# Patient Record
Sex: Female | Born: 1940 | Race: White | Hispanic: No | Marital: Married | Smoking: Never smoker
Health system: Southern US, Community
[De-identification: ages and names within clinical notes are randomized; demographics above are authoritative.]

## PROBLEM LIST (undated history)

## (undated) DIAGNOSIS — J45909 Unspecified asthma, uncomplicated: Secondary | ICD-10-CM

## (undated) DIAGNOSIS — C801 Malignant (primary) neoplasm, unspecified: Secondary | ICD-10-CM

---

## 2010-09-11 ENCOUNTER — Ambulatory Visit: Payer: Self-pay | Admitting: *Deleted

## 2012-12-23 ENCOUNTER — Ambulatory Visit: Payer: Self-pay | Admitting: Family Medicine

## 2012-12-23 LAB — CBC WITH DIFFERENTIAL/PLATELET
Eosinophil #: 0.1 10*3/uL (ref 0.0–0.7)
HCT: 45.8 % (ref 35.0–47.0)
HGB: 15.1 g/dL (ref 12.0–16.0)
Lymphocyte %: 13.9 %
MCH: 32.6 pg (ref 26.0–34.0)
MCV: 99 fL (ref 80–100)
Monocyte %: 3.7 %
Neutrophil #: 6.8 10*3/uL — ABNORMAL HIGH (ref 1.4–6.5)
Neutrophil %: 81.3 %
RBC: 4.63 10*6/uL (ref 3.80–5.20)
RDW: 12.9 % (ref 11.5–14.5)
WBC: 8.4 10*3/uL (ref 3.6–11.0)

## 2012-12-23 LAB — COMPREHENSIVE METABOLIC PANEL
Alkaline Phosphatase: 106 U/L (ref 50–136)
Anion Gap: 10 (ref 7–16)
BUN: 20 mg/dL — ABNORMAL HIGH (ref 7–18)
Chloride: 105 mmol/L (ref 98–107)
Co2: 29 mmol/L (ref 21–32)
EGFR (Non-African Amer.): 60 — ABNORMAL LOW
Glucose: 111 mg/dL — ABNORMAL HIGH (ref 65–99)
SGOT(AST): 17 U/L (ref 15–37)
SGPT (ALT): 31 U/L (ref 12–78)
Sodium: 144 mmol/L (ref 136–145)
Total Protein: 7.1 g/dL (ref 6.4–8.2)

## 2013-03-24 ENCOUNTER — Ambulatory Visit: Payer: Self-pay | Admitting: Family Medicine

## 2014-02-18 ENCOUNTER — Ambulatory Visit: Payer: Self-pay | Admitting: Emergency Medicine

## 2014-02-19 ENCOUNTER — Ambulatory Visit: Payer: Self-pay | Admitting: Physician Assistant

## 2015-03-11 IMAGING — CT CT HEAD WITHOUT CONTRAST
1 series · 16 of 30 positions shown, 20 images · non-contrast
Comparison: none

REASON FOR EXAM: STAT CALLREPORT [id number]  RT occipital headache x 2wks
 w dizzness nausea
COMMENTS:

[Series 2: soft tissue · axial · 0.39mm/px · z∈[-28,+107]mm · 16 of 31 slices shown, 20 images]
[im 2/31  brain]
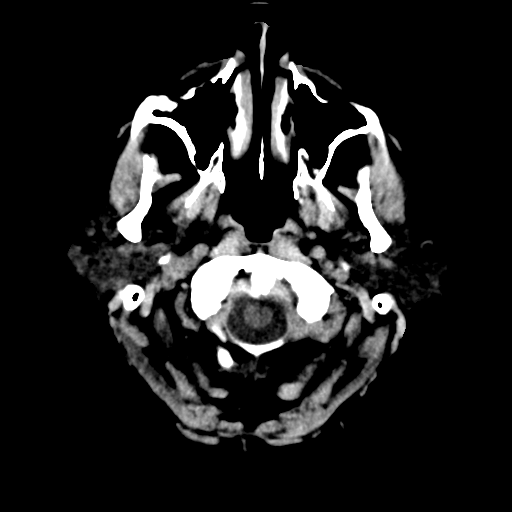
[im 2/31  bone]
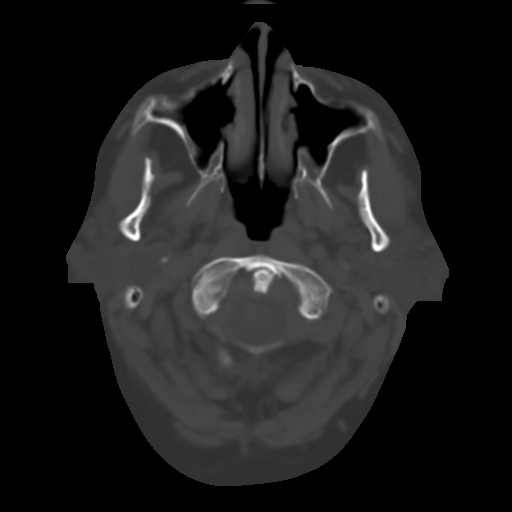
[im 4/31  brain]
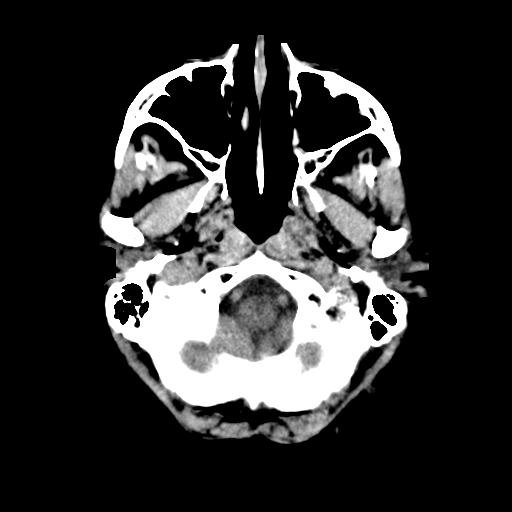
[im 6/31  brain]
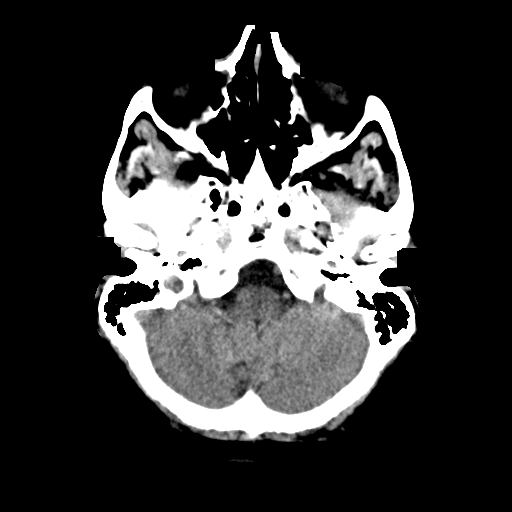
[im 8/31  brain]
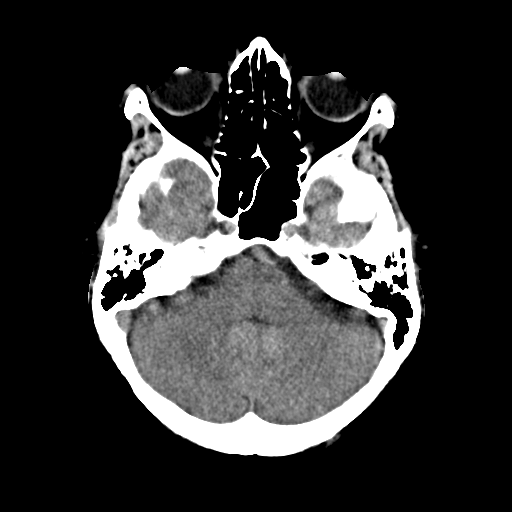
[im 9/31  brain]
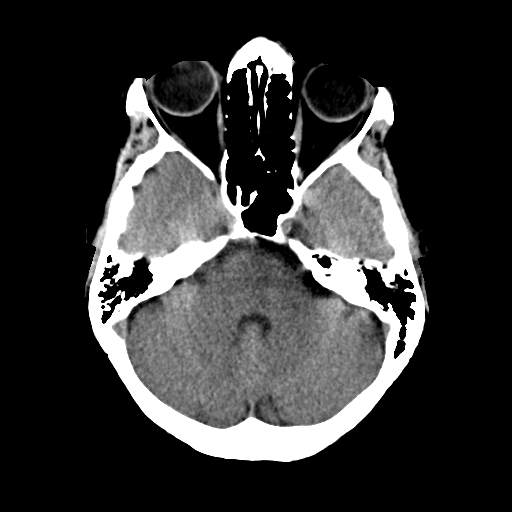
[im 9/31  bone]
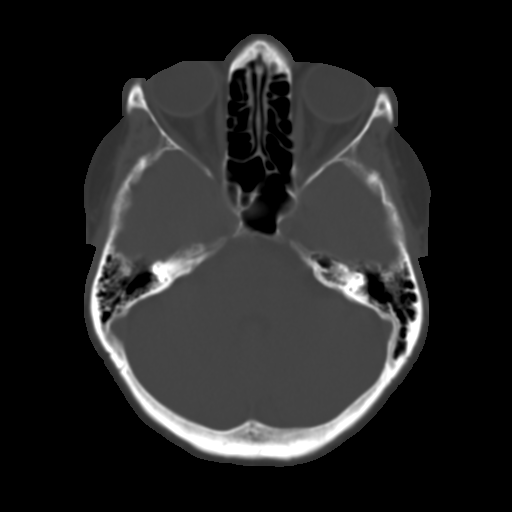
[im 11/31  brain]
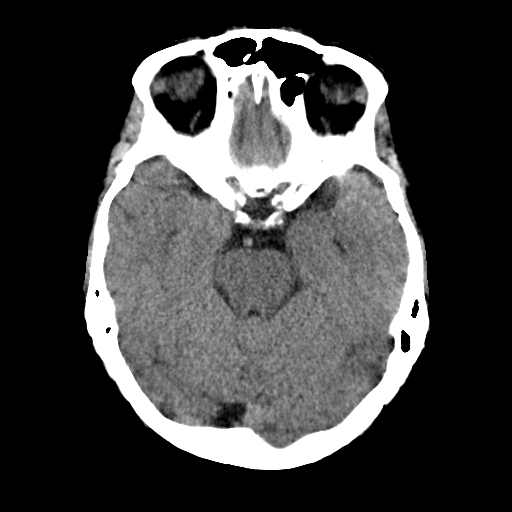
[im 13/31  brain]
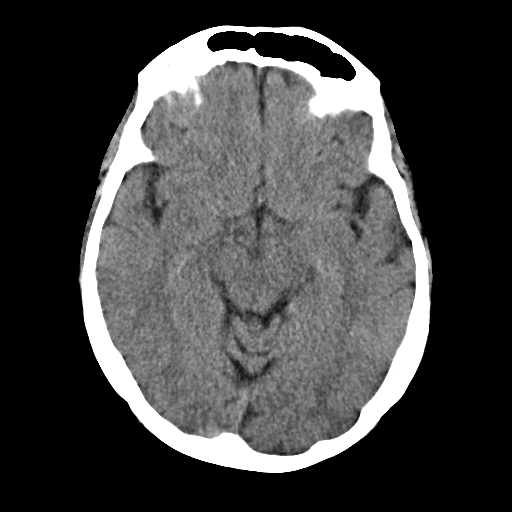
[im 15/31  brain]
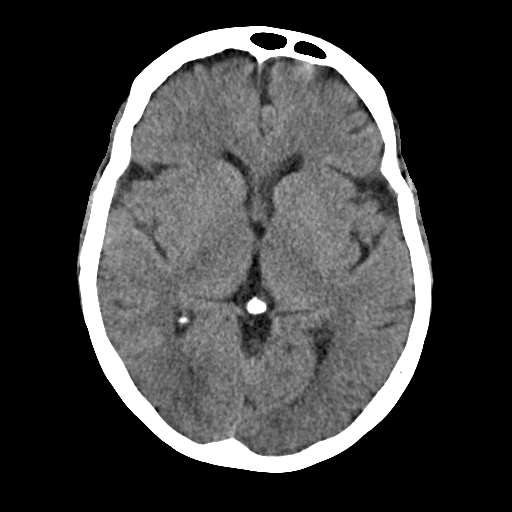
[im 16/31  brain]
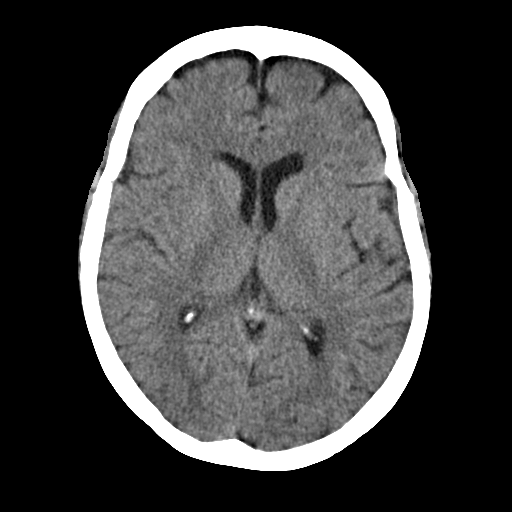
[im 16/31  bone]
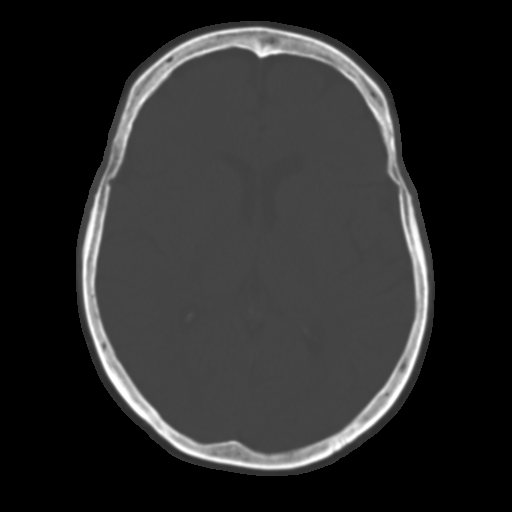
[im 18/31  brain]
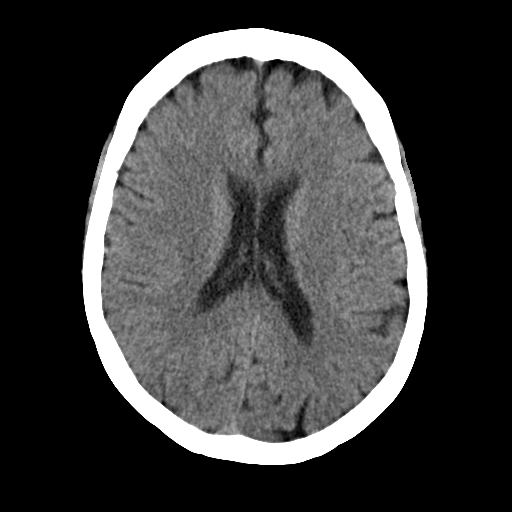
[im 20/31  brain]
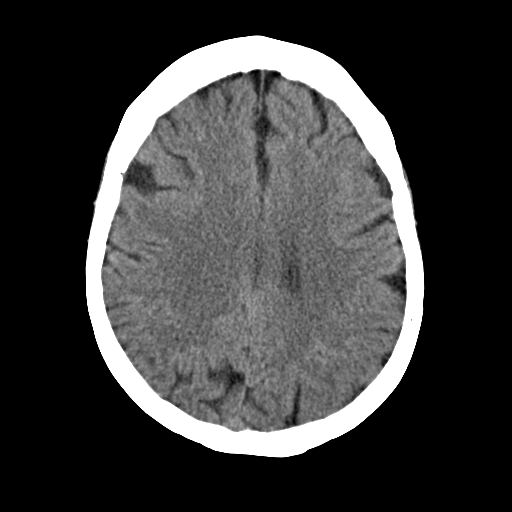
[im 22/31  brain]
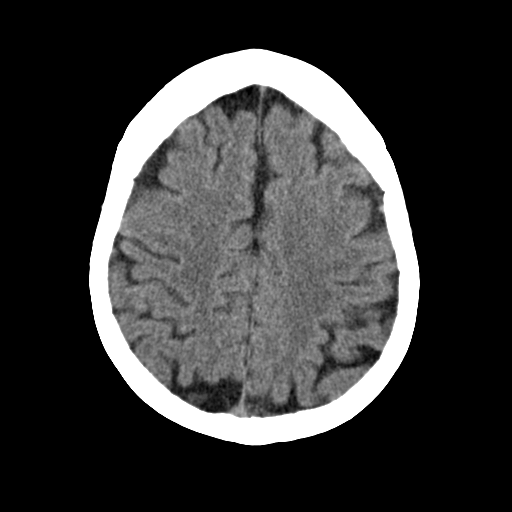
[im 23/31  brain]
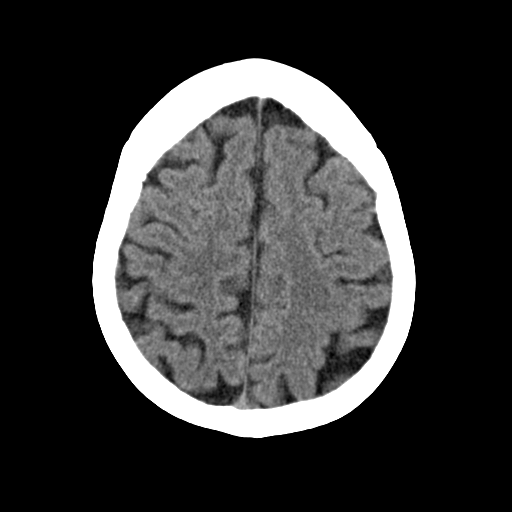
[im 23/31  bone]
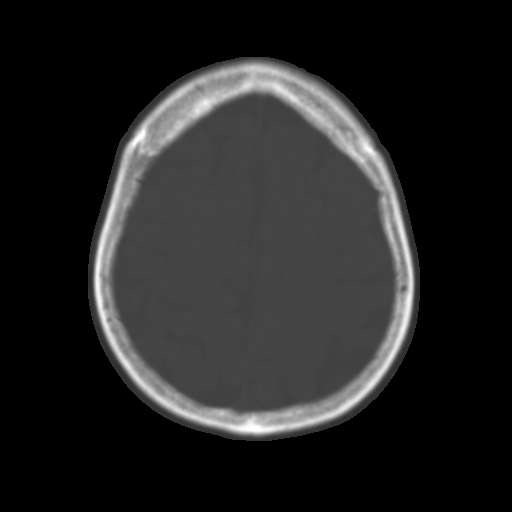
[im 25/31  brain]
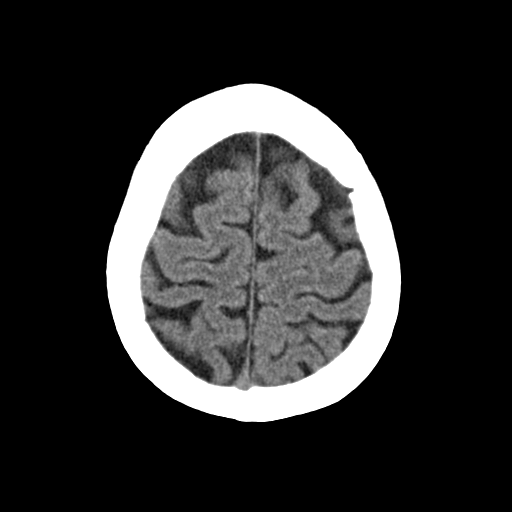
[im 27/31  brain]
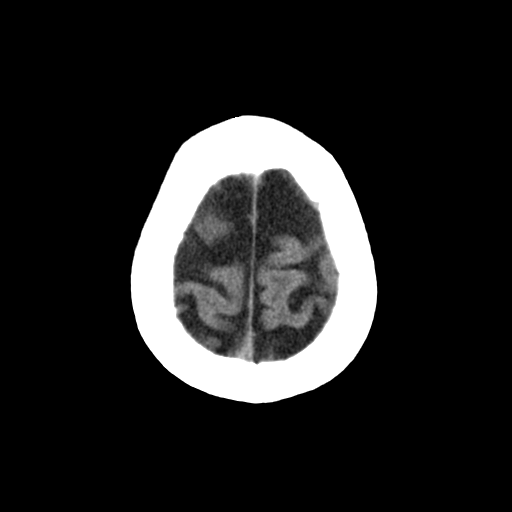
[im 29/31  brain]
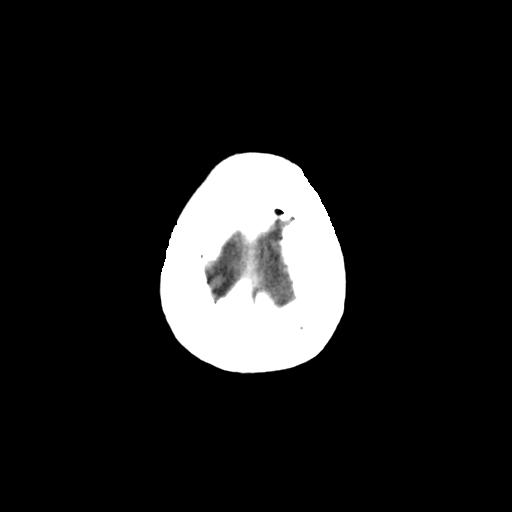

[16 of 30 positions shown; findings below may reference images not displayed]

PROCEDURE:     [facility] WITHOUT CONTRAST  - December 23, 2012  [DATE]

RESULT:     A noncontrast CT of the brain is performed in the standard
fashion. The patient has no previous exam for comparison. There is mild
prominence of the ventricles and sulci. There is no intracranial hemorrhage,
mass, mass effect or midline shift. There is no evolving infarct evident.
The orbits appear to be unremarkable. The sinuses and mastoid air cells show
grossly normal aeration area the calvarium appears intact.
IMPRESSION: 1. Mild atrophy. No acute intracranial abnormality.

[REDACTED]

## 2015-05-21 ENCOUNTER — Ambulatory Visit
Admission: RE | Admit: 2015-05-21 | Discharge: 2015-05-21 | Disposition: A | Discharge status: Home / Self Care | Payer: Medicare Other | Source: Ambulatory Visit | Attending: Family Medicine | Admitting: Family Medicine

## 2015-05-21 ENCOUNTER — Other Ambulatory Visit: Payer: Self-pay | Admitting: Family Medicine

## 2015-05-21 DIAGNOSIS — Z1231 Encounter for screening mammogram for malignant neoplasm of breast: Secondary | ICD-10-CM | POA: Diagnosis not present

## 2015-05-21 DIAGNOSIS — M8580 Other specified disorders of bone density and structure, unspecified site: Secondary | ICD-10-CM

## 2015-05-21 DIAGNOSIS — M8588 Other specified disorders of bone density and structure, other site: Secondary | ICD-10-CM | POA: Diagnosis not present

## 2015-05-21 DIAGNOSIS — M85851 Other specified disorders of bone density and structure, right thigh: Secondary | ICD-10-CM | POA: Diagnosis not present

## 2015-05-21 HISTORY — DX: Malignant (primary) neoplasm, unspecified: C80.1

## 2015-05-22 ENCOUNTER — Other Ambulatory Visit: Payer: Self-pay | Admitting: Family Medicine

## 2015-05-22 DIAGNOSIS — Z1231 Encounter for screening mammogram for malignant neoplasm of breast: Secondary | ICD-10-CM

## 2015-05-22 DIAGNOSIS — M8580 Other specified disorders of bone density and structure, unspecified site: Secondary | ICD-10-CM

## 2020-04-16 ENCOUNTER — Other Ambulatory Visit: Payer: Self-pay | Admitting: Internal Medicine

## 2020-04-16 DIAGNOSIS — R0602 Shortness of breath: Secondary | ICD-10-CM

## 2020-04-16 DIAGNOSIS — I480 Paroxysmal atrial fibrillation: Secondary | ICD-10-CM

## 2020-04-16 DIAGNOSIS — I4891 Unspecified atrial fibrillation: Secondary | ICD-10-CM

## 2020-04-22 ENCOUNTER — Other Ambulatory Visit: Payer: Self-pay

## 2020-04-22 ENCOUNTER — Ambulatory Visit
Admission: RE | Admit: 2020-04-22 | Discharge: 2020-04-22 | Disposition: A | Discharge status: Home / Self Care | Payer: Medicare PPO | Source: Ambulatory Visit | Attending: Internal Medicine | Admitting: Internal Medicine

## 2020-04-22 DIAGNOSIS — I4891 Unspecified atrial fibrillation: Secondary | ICD-10-CM | POA: Diagnosis present

## 2020-04-22 DIAGNOSIS — R0602 Shortness of breath: Secondary | ICD-10-CM | POA: Insufficient documentation

## 2020-04-22 DIAGNOSIS — I480 Paroxysmal atrial fibrillation: Secondary | ICD-10-CM | POA: Diagnosis present

## 2020-04-22 LAB — NM MYOCAR MULTI W/SPECT W/WALL MOTION / EF
Estimated workload: 1 METS
Exercise duration (sec): 55 s
LV dias vol: 52 mL (ref 46–106)
LV sys vol: 20 mL
MPHR: 138 {beats}/min
Peak HR: 93 {beats}/min
Percent HR: 67 %
Rest HR: 74 {beats}/min
SDS: 0
SRS: 6
SSS: 4
TID: 1.04

## 2020-04-22 MED ORDER — TECHNETIUM TC 99M TETROFOSMIN IV KIT
30.7300 | PACK | Freq: Once | INTRAVENOUS | Status: AC | PRN
  Administered 2020-04-22: 30.73 via INTRAVENOUS

## 2020-04-22 MED ORDER — TECHNETIUM TC 99M TETROFOSMIN IV KIT
9.7510 | PACK | Freq: Once | INTRAVENOUS | Status: AC | PRN
  Administered 2020-04-22: 9.751 via INTRAVENOUS

## 2020-04-22 MED ORDER — REGADENOSON 0.4 MG/5ML IV SOLN
0.4000 mg | Freq: Once | INTRAVENOUS | Status: AC
  Administered 2020-04-22: 0.4 mg via INTRAVENOUS

## 2022-07-13 ENCOUNTER — Ambulatory Visit
Admission: EM | Admit: 2022-07-13 | Discharge: 2022-07-13 | Disposition: A | Discharge status: Home / Self Care | Payer: Medicare PPO

## 2022-07-13 ENCOUNTER — Encounter: Payer: Self-pay | Admitting: Emergency Medicine

## 2022-07-13 DIAGNOSIS — W57XXXA Bitten or stung by nonvenomous insect and other nonvenomous arthropods, initial encounter: Secondary | ICD-10-CM | POA: Diagnosis not present

## 2022-07-13 DIAGNOSIS — S80862A Insect bite (nonvenomous), left lower leg, initial encounter: Secondary | ICD-10-CM | POA: Diagnosis not present

## 2022-07-13 HISTORY — DX: Unspecified asthma, uncomplicated: J45.909

## 2022-07-13 NOTE — ED Provider Notes (Signed)
[facility]    CSN: [id number] Arrival date & time: 07/13/22  1338      History   Chief Complaint Chief Complaint  Patient presents with   Rash    HPI Kimberly Blackwell is a 82 y.o. female who presents with a rash behind her L leg x 3 days. She was at the beach when she first noticed it. Felt some itching and a bump. She only walked to the sand, stuck her toes in the water, then went back to her place. She has been applying a steroid cream prescribed by her doctor to help the itching and has stopped itching, but noticed it turn from red to purple. Her kids wanted her to be examined. She denies pain.     Past Medical History:  Diagnosis Date   Asthma    Cancer (HCC)    skin ca    There are no problems to display for this patient.   History reviewed. No pertinent surgical history.  OB History   No obstetric history on file.      Home Medications    Prior to Admission medications   Medication Sig Start Date End Date Taking? Authorizing Provider  albuterol (VENTOLIN HFA) 108 (90 Base) MCG/ACT inhaler Inhale into the lungs. 06/25/22 06/25/23 Yes [provider]  azelastine (ASTELIN) 0.1 % nasal spray Place into the nose. 06/26/12 06/24/23 Yes [provider]  fexofenadine (ALLEGRA) 180 MG tablet Take by mouth. 04/30/22 04/30/23 Yes [provider]  fluticasone (FLONASE) 50 MCG/ACT nasal spray Place into the nose. 10/31/21 11/01/22 Yes [provider]  ipratropium-albuterol (DUONEB) 0.5-2.5 (3) MG/3ML SOLN Inhale into the lungs. 10/31/21  Yes [provider]  levocetirizine (XYZAL) 5 MG tablet Take 1 tablet by mouth every evening. 04/30/22 04/30/23 Yes [provider]  metoprolol tartrate (LOPRESSOR) 25 MG tablet Take 1 tablet by mouth 2 (two) times daily. 05/18/22  Yes [provider]  SPIRIVA RESPIMAT 1.25 MCG/ACT AERS Inhale into the lungs. 06/25/22 06/25/23 Yes [provider]  spironolactone (ALDACTONE) 25  MG tablet Take 1 tablet by mouth daily. 03/09/22  Yes [provider]  valsartan (DIOVAN) 160 MG tablet Take 1 tablet by mouth 2 (two) times daily. 04/27/22  Yes [provider]  ADVAIR DISKUS 500-50 MCG/ACT AEPB SMARTSIG:1 Puff(s) By Mouth Every 12 Hours    [provider]  ELIQUIS 5 MG TABS tablet Take 5 mg by mouth 2 (two) times daily.    [provider]  montelukast (SINGULAIR) 10 MG tablet Take 10 mg by mouth daily.    [provider]  potassium chloride (KLOR-CON) 10 MEQ tablet Take 10 mEq by mouth 2 (two) times daily.    [provider]    Family History Family History  Problem Relation Age of Onset   Breast cancer Cousin        mat cousin    Social History Social History   Tobacco Use   Smoking status: Never   Smokeless tobacco: Never  Vaping Use   Vaping Use: Never used  Substance Use Topics   Alcohol use: Not Currently     Allergies   Iodinated contrast media, Levofloxacin, Sulfa antibiotics, Morphine, and Penicillins   Review of Systems Review of Systems   Physical Exam Triage Vital Signs ED Triage Vitals  Enc Vitals Group     BP 07/13/22 1656 116/74     Pulse Rate 07/13/22 1656 62     Resp 07/13/22 1656 16  Temp 07/13/22 1656 97.7 F (36.5 C)     Temp Source 07/13/22 1656 Oral     SpO2 07/13/22 1656 95 %     Weight --      Height --      Head Circumference --      Peak Flow --      Pain Score 07/13/22 1652 0     Pain Loc --      Pain Edu? --      Excl. in GC? --    No data found.  Updated Vital Signs BP 116/74 (BP Location: Left Arm)   Pulse 62   Temp 97.7 F (36.5 C) (Oral)   Resp 16   SpO2 99%   Visual Acuity Right Eye Distance:   Left Eye Distance:   Bilateral Distance:    Right Eye Near:   Left Eye Near:    Bilateral Near:     Physical Exam Vitals and nursing note reviewed.  Constitutional:      General: She is not in acute distress.    Appearance: She is obese. She is  not toxic-appearing.  Eyes:     General: No scleral icterus.    Conjunctiva/sclera: Conjunctivae normal.  Musculoskeletal:        General: Normal range of motion.     Cervical back: Neck supple.  Skin:    Comments: L lower leg- Posterior proximal region shows 2 insect bite areas of induration, with bruising around them. There is no hotness or streaks present.   Neurological:     Mental Status: She is alert and oriented to person, place, and time.     Gait: Gait normal.  Psychiatric:        Mood and Affect: Mood normal.        Behavior: Behavior normal.      UC Treatments / Results  Labs (all labs ordered are listed, but only abnormal results are displayed) Labs Reviewed - No data to display  EKG   Radiology No results found.  Procedures Procedures (including critical care time)  Medications Ordered in UC Medications - No data to display  Initial Impression / Assessment and Plan / UC Course  I have reviewed the triage vital signs and the nursing notes.  Insect bite Bruising  I advised to d/c using the steroid cream since it is not itching and give it 7-10 days to resolve.      Final Clinical Impressions(s) / UC Diagnoses   Final diagnoses:  Insect bite of left lower leg, initial encounter     Discharge Instructions      Stop using the cream for now. You have bruising under the skin from the insect bites, but is not infection.      ED Prescriptions   None    PDMP not reviewed this encounter.   Garey Ham, [place] 07/13/22 1730

## 2022-07-13 NOTE — Discharge Instructions (Signed)
Stop using the cream for now. You have bruising under the skin from the insect bites, but is not infection.

## 2022-07-13 NOTE — ED Triage Notes (Signed)
Pt presents with a rash behind her left leg x 3 days. Patient was at the beach when she first noticed it and thinks it may be a bug bite.
